# Patient Record
Sex: Male | Born: 1965
Health system: Southern US, Community
[De-identification: ages and names within clinical notes are randomized; demographics above are authoritative.]

## PROBLEM LIST (undated history)

## (undated) DIAGNOSIS — M1990 Unspecified osteoarthritis, unspecified site: Secondary | ICD-10-CM

## (undated) DIAGNOSIS — C61 Malignant neoplasm of prostate: Secondary | ICD-10-CM

## (undated) HISTORY — PX: PROSTATE BIOPSY: SHX241

## (undated) HISTORY — PX: TOTAL KNEE ARTHROPLASTY: SHX125

---

## 1990-12-01 HISTORY — PX: ANTERIOR CRUCIATE LIGAMENT REPAIR: SHX115

## 2017-03-06 DIAGNOSIS — M1712 Unilateral primary osteoarthritis, left knee: Secondary | ICD-10-CM | POA: Diagnosis not present

## 2017-03-06 DIAGNOSIS — M1711 Unilateral primary osteoarthritis, right knee: Secondary | ICD-10-CM | POA: Diagnosis not present

## 2017-03-13 DIAGNOSIS — M1711 Unilateral primary osteoarthritis, right knee: Secondary | ICD-10-CM | POA: Diagnosis not present

## 2017-03-13 DIAGNOSIS — M1712 Unilateral primary osteoarthritis, left knee: Secondary | ICD-10-CM | POA: Diagnosis not present

## 2017-03-13 DIAGNOSIS — M25562 Pain in left knee: Secondary | ICD-10-CM | POA: Diagnosis not present

## 2017-03-13 DIAGNOSIS — M25561 Pain in right knee: Secondary | ICD-10-CM | POA: Diagnosis not present

## 2017-03-13 DIAGNOSIS — G8929 Other chronic pain: Secondary | ICD-10-CM | POA: Diagnosis not present

## 2017-05-15 DIAGNOSIS — Z7189 Other specified counseling: Secondary | ICD-10-CM | POA: Diagnosis not present

## 2017-05-25 DIAGNOSIS — M130 Polyarthritis, unspecified: Secondary | ICD-10-CM | POA: Diagnosis not present

## 2017-06-04 DIAGNOSIS — Z125 Encounter for screening for malignant neoplasm of prostate: Secondary | ICD-10-CM | POA: Diagnosis not present

## 2017-06-04 DIAGNOSIS — Z0189 Encounter for other specified special examinations: Secondary | ICD-10-CM | POA: Diagnosis not present

## 2017-06-09 DIAGNOSIS — Z0189 Encounter for other specified special examinations: Secondary | ICD-10-CM | POA: Diagnosis not present

## 2017-06-12 DIAGNOSIS — M130 Polyarthritis, unspecified: Secondary | ICD-10-CM | POA: Diagnosis not present

## 2017-06-12 DIAGNOSIS — Z6826 Body mass index (BMI) 26.0-26.9, adult: Secondary | ICD-10-CM | POA: Diagnosis not present

## 2017-06-12 DIAGNOSIS — R972 Elevated prostate specific antigen [PSA]: Secondary | ICD-10-CM | POA: Diagnosis not present

## 2017-06-12 DIAGNOSIS — Z0001 Encounter for general adult medical examination with abnormal findings: Secondary | ICD-10-CM | POA: Diagnosis not present

## 2017-06-18 DIAGNOSIS — N402 Nodular prostate without lower urinary tract symptoms: Secondary | ICD-10-CM | POA: Diagnosis not present

## 2017-06-18 DIAGNOSIS — Z7189 Other specified counseling: Secondary | ICD-10-CM | POA: Diagnosis not present

## 2017-06-18 DIAGNOSIS — R972 Elevated prostate specific antigen [PSA]: Secondary | ICD-10-CM | POA: Diagnosis not present

## 2017-06-18 DIAGNOSIS — F17290 Nicotine dependence, other tobacco product, uncomplicated: Secondary | ICD-10-CM | POA: Diagnosis not present

## 2017-07-28 DIAGNOSIS — Z96651 Presence of right artificial knee joint: Secondary | ICD-10-CM | POA: Diagnosis not present

## 2017-07-28 DIAGNOSIS — Z9889 Other specified postprocedural states: Secondary | ICD-10-CM | POA: Diagnosis not present

## 2017-07-29 DIAGNOSIS — R972 Elevated prostate specific antigen [PSA]: Secondary | ICD-10-CM | POA: Diagnosis not present

## 2017-07-31 DIAGNOSIS — R3914 Feeling of incomplete bladder emptying: Secondary | ICD-10-CM | POA: Diagnosis not present

## 2017-07-31 DIAGNOSIS — R972 Elevated prostate specific antigen [PSA]: Secondary | ICD-10-CM | POA: Diagnosis not present

## 2017-09-01 DIAGNOSIS — R972 Elevated prostate specific antigen [PSA]: Secondary | ICD-10-CM | POA: Diagnosis not present

## 2017-09-15 ENCOUNTER — Encounter: Payer: Self-pay | Admitting: Radiation Oncology

## 2017-09-21 DIAGNOSIS — N5201 Erectile dysfunction due to arterial insufficiency: Secondary | ICD-10-CM | POA: Diagnosis not present

## 2017-09-21 DIAGNOSIS — C61 Malignant neoplasm of prostate: Secondary | ICD-10-CM | POA: Diagnosis not present

## 2017-09-23 ENCOUNTER — Other Ambulatory Visit: Payer: Self-pay | Admitting: Urology

## 2017-09-23 ENCOUNTER — Encounter: Payer: Self-pay | Admitting: Radiation Oncology

## 2017-09-23 NOTE — Progress Notes (Signed)
GU Location of Tumor / Histology: prostatic adenocarcinoma  If Prostate Cancer, Gleason Score is (3 + 4) and PSA is (8.0). Prostate volume: 36.8 grams  Arville Go was referred by Dr. Lucianne Lei, MD for evaluation of an elevated PSA.  Biopsies of prostate (if applicable) revealed:    Past/Anticipated interventions by urology, if any: prostate biopsy  Past/Anticipated interventions by medical oncology, if any: no  Weight changes, if any: no  Bowel/Bladder complaints, if any: sensation of not emptying his bladder,    Nausea/Vomiting, if any: no  Pain issues, if any:  no  SAFETY ISSUES:  Prior radiation? no  Pacemaker/ICD? no  Possible current pregnancy? no  Is the patient on methotrexate? no  Current Complaints / other details:  51 year old. Male. Married. Father with hx of prostate ca. NKA.

## 2017-09-24 ENCOUNTER — Encounter: Payer: Self-pay | Admitting: Urology

## 2017-09-24 NOTE — Progress Notes (Signed)
Per Anise Salvo, patient called to cancel his appointment with Dr. Tammi Klippel on 09/28/17. He stated he has decided to have Robotic Surgery and it is scheduled for 10/16/2017 with Dr. Tresa Moore.

## 2017-09-28 ENCOUNTER — Ambulatory Visit
Admission: RE | Admit: 2017-09-28 | Discharge: 2017-09-28 | Disposition: A | Discharge status: Home / Self Care | Payer: Self-pay | Source: Ambulatory Visit | Attending: Radiation Oncology | Admitting: Radiation Oncology

## 2017-09-28 HISTORY — DX: Malignant neoplasm of prostate: C61

## 2017-09-30 DIAGNOSIS — C61 Malignant neoplasm of prostate: Secondary | ICD-10-CM | POA: Diagnosis not present

## 2017-09-30 DIAGNOSIS — M6281 Muscle weakness (generalized): Secondary | ICD-10-CM | POA: Diagnosis not present

## 2017-10-08 DIAGNOSIS — M62838 Other muscle spasm: Secondary | ICD-10-CM | POA: Diagnosis not present

## 2017-10-08 DIAGNOSIS — M6281 Muscle weakness (generalized): Secondary | ICD-10-CM | POA: Diagnosis not present

## 2017-10-08 DIAGNOSIS — N393 Stress incontinence (female) (male): Secondary | ICD-10-CM | POA: Diagnosis not present

## 2017-10-12 NOTE — Progress Notes (Signed)
EKG 05-25-17 on chart from Dr Criss Rosales office

## 2017-10-12 NOTE — Patient Instructions (Signed)
AVEL OGAWA  10/12/2017   Your procedure is scheduled on: 10-16-17  Report to [facility] Main  Entrance Take [place]  elevators to 3rd floor to  [facility] at 530AM.    Call this number if you have problems the morning of surgery [phone]   Remember: ONLY 1 PERSON MAY GO WITH YOU TO SHORT STAY TO GET  READY MORNING OF YOUR SURGERY.  Do not eat food After Midnight on Wednesday 10-14-17. Drink plenty of clear liquids all day Thursday 10-15-17 and follow all bowel prep instructions provided by your surgeon. Nothing by mouth after midnight on Thursday!!     Take these medicines the morning of surgery with A SIP OF WATER: tylenol if needed                                You may not have any metal on your body including hair pins and              piercings  Do not wear jewelry, make-up, lotions, powders or perfumes, deodorant                          Men may shave face and neck.   Do not bring valuables to the hospital. [place].  Contacts, dentures or bridgework may not be worn into surgery.  Leave suitcase in the car. After surgery it may be brought to your room.                  Please read over the following fact sheets you were given: _____________________________________________________________________    CLEAR LIQUID DIET   Foods Allowed                                                                     Foods Excluded  Coffee and tea, regular and decaf                             liquids that you cannot  Plain Jell-O in any flavor                                             see through such as: Fruit ices (not with fruit pulp)                                     milk, soups, orange juice  Iced Popsicles                                    All solid food Carbonated beverages, regular and diet  Cranberry, grape and apple juices Sports drinks like  Gatorade Lightly seasoned clear broth or consume(fat free) Sugar, honey syrup  Sample Menu Breakfast                                Lunch                                     Supper Cranberry juice                    Beef broth                            Chicken broth Jell-O                                     Grape juice                           Apple juice Coffee or tea                        Jell-O                                      Popsicle                                                Coffee or tea                        Coffee or tea  _____________________________________________________________________  [facility] - Preparing for Surgery Before surgery, you can play an important role.  Because skin is not sterile, your skin needs to be as free of germs as possible.  You can reduce the number of germs on your skin by washing with CHG (chlorahexidine gluconate) soap before surgery.  CHG is an antiseptic cleaner which kills germs and bonds with the skin to continue killing germs even after washing. Please DO NOT use if you have an allergy to CHG or antibacterial soaps.  If your skin becomes reddened/irritated stop using the CHG and inform your nurse when you arrive at Short Stay. Do not shave (including legs and underarms) for at least 48 hours prior to the first CHG shower.  You may shave your face/neck. Please follow these instructions carefully:  1.  Shower with CHG Soap the night before surgery and the  morning of Surgery.  2.  If you choose to wash your hair, wash your hair first as usual with your  normal  shampoo.  3.  After you shampoo, rinse your hair and body thoroughly to remove the  shampoo.                           4.  Use CHG as you would any other liquid soap.  You can apply chg directly  to the skin and wash  Gently with a scrungie or clean washcloth.  5.  Apply the CHG Soap to your body ONLY FROM THE NECK DOWN.   Do not use on face/ open                            Wound or open sores. Avoid contact with eyes, ears mouth and genitals (private parts).                       Wash face,  Genitals (private parts) with your normal soap.             6.  Wash thoroughly, paying special attention to the area where your surgery  will be performed.  7.  Thoroughly rinse your body with warm water from the neck down.  8.  DO NOT shower/wash with your normal soap after using and rinsing off  the CHG Soap.                9.  Pat yourself dry with a clean towel.            10.  Wear clean pajamas.            11.  Place clean sheets on your bed the night of your first shower and do not  sleep with pets. Day of Surgery : Do not apply any lotions/deodorants the morning of surgery.  Please wear clean clothes to the hospital/surgery center.  FAILURE TO FOLLOW THESE INSTRUCTIONS MAY RESULT IN THE CANCELLATION OF YOUR SURGERY PATIENT SIGNATURE_________________________________  NURSE SIGNATURE__________________________________  ________________________________________________________________________

## 2017-10-14 ENCOUNTER — Other Ambulatory Visit: Payer: Self-pay

## 2017-10-14 ENCOUNTER — Encounter (HOSPITAL_COMMUNITY): Payer: Self-pay

## 2017-10-14 ENCOUNTER — Encounter (HOSPITAL_COMMUNITY)
Admission: RE | Admit: 2017-10-14 | Discharge: 2017-10-14 | Disposition: A | Discharge status: Home / Self Care | Payer: BLUE CROSS/BLUE SHIELD | Source: Ambulatory Visit | Attending: Urology | Admitting: Urology

## 2017-10-14 DIAGNOSIS — Z7982 Long term (current) use of aspirin: Secondary | ICD-10-CM | POA: Diagnosis not present

## 2017-10-14 DIAGNOSIS — C775 Secondary and unspecified malignant neoplasm of intrapelvic lymph nodes: Secondary | ICD-10-CM | POA: Diagnosis not present

## 2017-10-14 DIAGNOSIS — C61 Malignant neoplasm of prostate: Secondary | ICD-10-CM | POA: Diagnosis not present

## 2017-10-14 DIAGNOSIS — Z79899 Other long term (current) drug therapy: Secondary | ICD-10-CM | POA: Diagnosis not present

## 2017-10-14 DIAGNOSIS — Z8042 Family history of malignant neoplasm of prostate: Secondary | ICD-10-CM | POA: Diagnosis not present

## 2017-10-14 DIAGNOSIS — F17210 Nicotine dependence, cigarettes, uncomplicated: Secondary | ICD-10-CM | POA: Diagnosis not present

## 2017-10-14 DIAGNOSIS — Z96651 Presence of right artificial knee joint: Secondary | ICD-10-CM | POA: Diagnosis not present

## 2017-10-14 HISTORY — DX: Unspecified osteoarthritis, unspecified site: M19.90

## 2017-10-14 LAB — BASIC METABOLIC PANEL
Anion gap: 6 (ref 5–15)
BUN: 16 mg/dL (ref 6–20)
CALCIUM: 9.7 mg/dL (ref 8.9–10.3)
CO2: 24 mmol/L (ref 22–32)
CREATININE: 0.89 mg/dL (ref 0.61–1.24)
Chloride: 108 mmol/L (ref 101–111)
GFR calc Af Amer: >60 mL/min (ref >60)
GFR calc non Af Amer: >60 mL/min (ref >60)
GLUCOSE: 112 mg/dL — AB (ref 65–99)
Potassium: 4.2 mmol/L (ref 3.5–5.1)
Sodium: 138 mmol/L (ref 135–145)

## 2017-10-14 LAB — CBC
HEMATOCRIT: 41.5 % (ref 39.0–52.0)
Hemoglobin: 14.4 g/dL (ref 13.0–17.0)
MCH: 30.4 pg (ref 26.0–34.0)
MCHC: 34.7 g/dL (ref 30.0–36.0)
MCV: 87.6 fL (ref 78.0–100.0)
Platelets: 242 10*3/uL (ref 150–400)
RBC: 4.74 MIL/uL (ref 4.22–5.81)
RDW: 12.9 % (ref 11.5–15.5)
WBC: 5.2 10*3/uL (ref 4.0–10.5)

## 2017-10-14 NOTE — Progress Notes (Signed)
   10/14/17 0827  OBSTRUCTIVE SLEEP APNEA  Have you ever been diagnosed with sleep apnea through a sleep study? No  Do you snore loudly (loud enough to be heard through closed doors)?  1  Do you often feel tired, fatigued, or sleepy during the daytime (such as falling asleep during driving or talking to someone)? 0  Has anyone observed you stop breathing during your sleep? 1  Do you have, or are you being treated for high blood pressure? 0  BMI more than 35 kg/m2? 0  Age > 50 (1-yes) 1  Neck circumference greater than:Male 16 inches or larger, Male 17inches or larger? 1  Male Gender (Yes=1) 1  Obstructive Sleep Apnea Score 5

## 2017-10-16 ENCOUNTER — Ambulatory Visit (HOSPITAL_COMMUNITY): Payer: BLUE CROSS/BLUE SHIELD | Admitting: Certified Registered Nurse Anesthetist

## 2017-10-16 ENCOUNTER — Encounter (HOSPITAL_COMMUNITY)
Admission: RE | Disposition: A | Discharge status: Home / Self Care | Payer: Self-pay | Source: Ambulatory Visit | Attending: Urology

## 2017-10-16 ENCOUNTER — Other Ambulatory Visit: Payer: Self-pay

## 2017-10-16 ENCOUNTER — Encounter (HOSPITAL_COMMUNITY): Payer: Self-pay | Admitting: *Deleted

## 2017-10-16 ENCOUNTER — Observation Stay (HOSPITAL_COMMUNITY)
Admission: RE | Admit: 2017-10-16 | Discharge: 2017-10-17 | Disposition: A | Discharge status: Home / Self Care | Payer: BLUE CROSS/BLUE SHIELD | Source: Ambulatory Visit | Attending: Urology | Admitting: Urology

## 2017-10-16 DIAGNOSIS — C775 Secondary and unspecified malignant neoplasm of intrapelvic lymph nodes: Secondary | ICD-10-CM | POA: Diagnosis not present

## 2017-10-16 DIAGNOSIS — Z8042 Family history of malignant neoplasm of prostate: Secondary | ICD-10-CM | POA: Insufficient documentation

## 2017-10-16 DIAGNOSIS — Z79899 Other long term (current) drug therapy: Secondary | ICD-10-CM | POA: Diagnosis not present

## 2017-10-16 DIAGNOSIS — F17210 Nicotine dependence, cigarettes, uncomplicated: Secondary | ICD-10-CM | POA: Insufficient documentation

## 2017-10-16 DIAGNOSIS — Z96651 Presence of right artificial knee joint: Secondary | ICD-10-CM | POA: Insufficient documentation

## 2017-10-16 DIAGNOSIS — C61 Malignant neoplasm of prostate: Principal | ICD-10-CM | POA: Insufficient documentation

## 2017-10-16 DIAGNOSIS — Z7982 Long term (current) use of aspirin: Secondary | ICD-10-CM | POA: Diagnosis not present

## 2017-10-16 HISTORY — PX: ROBOT ASSISTED LAPAROSCOPIC RADICAL PROSTATECTOMY: SHX5141

## 2017-10-16 HISTORY — PX: LYMPHADENECTOMY: SHX5960

## 2017-10-16 LAB — HEMOGLOBIN AND HEMATOCRIT, BLOOD
HCT: 40.4 % (ref 39.0–52.0)
HEMOGLOBIN: 14.1 g/dL (ref 13.0–17.0)

## 2017-10-16 SURGERY — PROSTATECTOMY, RADICAL, ROBOT-ASSISTED, LAPAROSCOPIC
Anesthesia: General

## 2017-10-16 MED ORDER — ONDANSETRON HCL 4 MG/2ML IJ SOLN: 4.0000 mg | Freq: Once | INTRAMUSCULAR | Status: DC | PRN

## 2017-10-16 MED ORDER — FENTANYL CITRATE (PF) 250 MCG/5ML IJ SOLN
INTRAMUSCULAR | Status: AC
  Filled 2017-10-16: qty 5

## 2017-10-16 MED ORDER — SODIUM CHLORIDE 0.9 % IJ SOLN
INTRAMUSCULAR | Status: AC
  Filled 2017-10-16: qty 20

## 2017-10-16 MED ORDER — BUPIVACAINE LIPOSOME 1.3 % IJ SUSP
20.0000 mL | Freq: Once | INTRAMUSCULAR | Status: AC
  Administered 2017-10-16: 20 mL
  Filled 2017-10-16: qty 20

## 2017-10-16 MED ORDER — LACTATED RINGERS IV SOLN
INTRAVENOUS | Status: DC | PRN
  Administered 2017-10-16 (×2): via INTRAVENOUS

## 2017-10-16 MED ORDER — ONDANSETRON HCL 4 MG/2ML IJ SOLN
INTRAMUSCULAR | Status: AC
  Filled 2017-10-16: qty 2

## 2017-10-16 MED ORDER — INDOCYANINE GREEN 25 MG IV SOLR
INTRAVENOUS | Status: DC | PRN
  Administered 2017-10-16: 1 mg

## 2017-10-16 MED ORDER — PROMETHAZINE HCL 25 MG/ML IJ SOLN
25.0000 mg | INTRAMUSCULAR | Status: DC | PRN
  Administered 2017-10-16: 25 mg via INTRAVENOUS
  Filled 2017-10-16: qty 1

## 2017-10-16 MED ORDER — FENTANYL CITRATE (PF) 100 MCG/2ML IJ SOLN
INTRAMUSCULAR | Status: DC | PRN
  Administered 2017-10-16: 100 ug via INTRAVENOUS
  Administered 2017-10-16 (×4): 50 ug via INTRAVENOUS

## 2017-10-16 MED ORDER — MIDAZOLAM HCL 2 MG/2ML IJ SOLN
INTRAMUSCULAR | Status: AC
  Filled 2017-10-16: qty 2

## 2017-10-16 MED ORDER — LACTATED RINGERS IR SOLN
Status: DC | PRN
  Administered 2017-10-16: 1000 mL

## 2017-10-16 MED ORDER — PROPOFOL 10 MG/ML IV BOLUS
INTRAVENOUS | Status: DC | PRN
  Administered 2017-10-16: 150 mg via INTRAVENOUS

## 2017-10-16 MED ORDER — MEPERIDINE HCL 50 MG/ML IJ SOLN: 6.2500 mg | INTRAMUSCULAR | Status: DC | PRN

## 2017-10-16 MED ORDER — HYDROCODONE-ACETAMINOPHEN 5-325 MG PO TABS: 1.0000 | ORAL_TABLET | Freq: Four times a day (QID) | ORAL | 0 refills | Status: AC | PRN

## 2017-10-16 MED ORDER — DEXAMETHASONE SODIUM PHOSPHATE 10 MG/ML IJ SOLN
INTRAMUSCULAR | Status: DC | PRN
  Administered 2017-10-16: 10 mg via INTRAVENOUS

## 2017-10-16 MED ORDER — PROPOFOL 10 MG/ML IV BOLUS
INTRAVENOUS | Status: AC
  Filled 2017-10-16: qty 20

## 2017-10-16 MED ORDER — SODIUM CHLORIDE 0.9 % IV BOLUS (SEPSIS)
1000.0000 mL | Freq: Once | INTRAVENOUS | Status: AC
  Administered 2017-10-16: 1000 mL via INTRAVENOUS

## 2017-10-16 MED ORDER — HYDROMORPHONE HCL 1 MG/ML IJ SOLN
0.5000 mg | INTRAMUSCULAR | Status: DC | PRN
  Administered 2017-10-16 (×4): 1 mg via INTRAVENOUS
  Filled 2017-10-16 (×4): qty 1

## 2017-10-16 MED ORDER — HYDROMORPHONE HCL 1 MG/ML IJ SOLN
0.2500 mg | INTRAMUSCULAR | Status: DC | PRN
  Administered 2017-10-16 (×2): 0.5 mg via INTRAVENOUS

## 2017-10-16 MED ORDER — SULFAMETHOXAZOLE-TRIMETHOPRIM 800-160 MG PO TABS: 1.0000 | ORAL_TABLET | Freq: Two times a day (BID) | ORAL | 0 refills | Status: AC

## 2017-10-16 MED ORDER — ACETAMINOPHEN 500 MG PO TABS
1000.0000 mg | ORAL_TABLET | Freq: Four times a day (QID) | ORAL | Status: AC
  Administered 2017-10-16 – 2017-10-17 (×4): 1000 mg via ORAL
  Filled 2017-10-16 (×4): qty 2

## 2017-10-16 MED ORDER — ROCURONIUM BROMIDE 10 MG/ML (PF) SYRINGE
PREFILLED_SYRINGE | INTRAVENOUS | Status: DC | PRN
  Administered 2017-10-16 (×3): 10 mg via INTRAVENOUS
  Administered 2017-10-16: 20 mg via INTRAVENOUS
  Administered 2017-10-16: 50 mg via INTRAVENOUS

## 2017-10-16 MED ORDER — SUGAMMADEX SODIUM 200 MG/2ML IV SOLN
INTRAVENOUS | Status: DC | PRN
  Administered 2017-10-16: 200 mg via INTRAVENOUS

## 2017-10-16 MED ORDER — DIPHENHYDRAMINE HCL 50 MG/ML IJ SOLN: 12.5000 mg | Freq: Four times a day (QID) | INTRAMUSCULAR | Status: DC | PRN

## 2017-10-16 MED ORDER — CEFAZOLIN SODIUM-DEXTROSE 2-4 GM/100ML-% IV SOLN
INTRAVENOUS | Status: AC
  Filled 2017-10-16: qty 100

## 2017-10-16 MED ORDER — PROMETHAZINE HCL 25 MG/ML IJ SOLN: 25.0000 mg | INTRAMUSCULAR | Status: DC | PRN

## 2017-10-16 MED ORDER — OXYCODONE HCL 5 MG PO TABS: 5.0000 mg | ORAL_TABLET | ORAL | Status: DC | PRN

## 2017-10-16 MED ORDER — ONDANSETRON HCL 4 MG/2ML IJ SOLN
INTRAMUSCULAR | Status: DC | PRN
  Administered 2017-10-16: 4 mg via INTRAVENOUS

## 2017-10-16 MED ORDER — MIDAZOLAM HCL 5 MG/5ML IJ SOLN
INTRAMUSCULAR | Status: DC | PRN
Start: 2017-10-16 — End: 2017-10-16
  Administered 2017-10-16: 2 mg via INTRAVENOUS

## 2017-10-16 MED ORDER — ONDANSETRON HCL 4 MG/2ML IJ SOLN
4.0000 mg | INTRAMUSCULAR | Status: DC | PRN
  Administered 2017-10-16: 4 mg via INTRAVENOUS
  Filled 2017-10-16: qty 2

## 2017-10-16 MED ORDER — SODIUM CHLORIDE 0.9 % IJ SOLN
INTRAMUSCULAR | Status: DC | PRN
  Administered 2017-10-16: 20 mL

## 2017-10-16 MED ORDER — CEFAZOLIN SODIUM-DEXTROSE 2-4 GM/100ML-% IV SOLN
2.0000 g | INTRAVENOUS | Status: AC
  Administered 2017-10-16: 2 g via INTRAVENOUS

## 2017-10-16 MED ORDER — DIPHENHYDRAMINE HCL 12.5 MG/5ML PO ELIX: 12.5000 mg | ORAL_SOLUTION | Freq: Four times a day (QID) | ORAL | Status: DC | PRN

## 2017-10-16 MED ORDER — HYDROMORPHONE HCL 1 MG/ML IJ SOLN
INTRAMUSCULAR | Status: AC
  Filled 2017-10-16: qty 2

## 2017-10-16 MED ORDER — DEXTROSE-NACL 5-0.45 % IV SOLN
INTRAVENOUS | Status: DC
  Administered 2017-10-16 – 2017-10-17 (×3): via INTRAVENOUS

## 2017-10-16 SURGICAL SUPPLY — 73 items
ADH SKN CLS APL DERMABOND .7 (GAUZE/BANDAGES/DRESSINGS) ×2
APPLICATOR COTTON TIP 6IN STRL (MISCELLANEOUS) ×3 IMPLANT
BAG URO CATCHER STRL LF (MISCELLANEOUS) ×2 IMPLANT
CATH FOLEY 2WAY SLVR 18FR 30CC (CATHETERS) ×3 IMPLANT
CATH TIEMANN FOLEY 18FR 5CC (CATHETERS) ×3 IMPLANT
CHLORAPREP W/TINT 26ML (MISCELLANEOUS) ×3 IMPLANT
CLIP VESOLOCK LG 6/CT PURPLE (CLIP) ×8 IMPLANT
CLOTH BEACON ORANGE TIMEOUT ST (SAFETY) ×3 IMPLANT
CONT SPEC 4OZ CLIKSEAL STRL BL (MISCELLANEOUS) ×3 IMPLANT
COVER FOOTSWITCH UNIV (MISCELLANEOUS) IMPLANT
COVER SURGICAL LIGHT HANDLE (MISCELLANEOUS) ×3 IMPLANT
COVER TIP SHEARS 8 DVNC (MISCELLANEOUS) ×2 IMPLANT
COVER TIP SHEARS 8MM DA VINCI (MISCELLANEOUS) ×1
CUTTER ECHEON FLEX ENDO 45 340 (ENDOMECHANICALS) ×3 IMPLANT
DECANTER SPIKE VIAL GLASS SM (MISCELLANEOUS) ×3 IMPLANT
DERMABOND ADVANCED (GAUZE/BANDAGES/DRESSINGS) ×1
DERMABOND ADVANCED .7 DNX12 (GAUZE/BANDAGES/DRESSINGS) ×2 IMPLANT
DRAPE ARM DVNC X/XI (DISPOSABLE) ×8 IMPLANT
DRAPE COLUMN DVNC XI (DISPOSABLE) ×2 IMPLANT
DRAPE DA VINCI XI ARM (DISPOSABLE) ×4
DRAPE DA VINCI XI COLUMN (DISPOSABLE) ×1
DRAPE SURG IRRIG POUCH 19X23 (DRAPES) ×3 IMPLANT
DRSG TEGADERM 4X4.75 (GAUZE/BANDAGES/DRESSINGS) ×2 IMPLANT
ELECT PENCIL ROCKER SW 15FT (MISCELLANEOUS) ×1 IMPLANT
ELECT REM PT RETURN 15FT ADLT (MISCELLANEOUS) ×3 IMPLANT
GAUZE SPONGE 2X2 8PLY STRL LF (GAUZE/BANDAGES/DRESSINGS) ×2 IMPLANT
GLOVE BIO SURGEON STRL SZ 6.5 (GLOVE) ×5 IMPLANT
GLOVE BIOGEL M STRL SZ7.5 (GLOVE) ×8 IMPLANT
GLOVE BIOGEL PI IND STRL 7.5 (GLOVE) ×2 IMPLANT
GLOVE BIOGEL PI INDICATOR 7.5 (GLOVE) ×1
GOWN STRL REUS W/TWL LRG LVL3 (GOWN DISPOSABLE) ×11 IMPLANT
GOWN STRL REUS W/TWL XL LVL3 (GOWN DISPOSABLE) ×2 IMPLANT
HOLDER FOLEY CATH W/STRAP (MISCELLANEOUS) ×3 IMPLANT
IRRIG SUCT STRYKERFLOW 2 WTIP (MISCELLANEOUS) ×3
IRRIGATION SUCT STRKRFLW 2 WTP (MISCELLANEOUS) ×2 IMPLANT
IV LACTATED RINGERS 1000ML (IV SOLUTION) ×2 IMPLANT
KIT PROCEDURE DA VINCI SI (MISCELLANEOUS) ×1
KIT PROCEDURE DVNC SI (MISCELLANEOUS) ×2 IMPLANT
MANIFOLD NEPTUNE II (INSTRUMENTS) ×3 IMPLANT
NDL INSUFFLATION 14GA 120MM (NEEDLE) ×2 IMPLANT
NDL SPNL 22GX7 QUINCKE BK (NEEDLE) ×2 IMPLANT
NEEDLE INSUFFLATION 14GA 120MM (NEEDLE) ×3 IMPLANT
NEEDLE SPNL 22GX7 QUINCKE BK (NEEDLE) ×3 IMPLANT
PACK CYSTO (CUSTOM PROCEDURE TRAY) ×2 IMPLANT
PACK ROBOT UROLOGY CUSTOM (CUSTOM PROCEDURE TRAY) ×3 IMPLANT
PAD POSITIONING PINK XL (MISCELLANEOUS) ×1 IMPLANT
PORT ACCESS TROCAR AIRSEAL 12 (TROCAR) ×2 IMPLANT
PORT ACCESS TROCAR AIRSEAL 5M (TROCAR) ×1
RELOAD STAPLE 45 4.1 GRN THCK (STAPLE) ×2 IMPLANT
SEAL CANN UNIV 5-8 DVNC XI (MISCELLANEOUS) ×8 IMPLANT
SEAL XI 5MM-8MM UNIVERSAL (MISCELLANEOUS) ×4
SET TRI-LUMEN FLTR TB AIRSEAL (TUBING) ×3 IMPLANT
SLEEVE SURGEON STRL (DRAPES) ×1 IMPLANT
SOLUTION ELECTROLUBE (MISCELLANEOUS) ×3 IMPLANT
SPONGE GAUZE 2X2 STER 10/PKG (GAUZE/BANDAGES/DRESSINGS)
SPONGE LAP 4X18 X RAY DECT (DISPOSABLE) ×3 IMPLANT
STAPLE RELOAD 45 GRN (STAPLE) ×2 IMPLANT
STAPLE RELOAD 45MM GREEN (STAPLE) ×3
SUT ETHILON 3 0 PS 1 (SUTURE) ×3 IMPLANT
SUT MNCRL AB 4-0 PS2 18 (SUTURE) ×6 IMPLANT
SUT PDS AB 1 CT1 27 (SUTURE) ×6 IMPLANT
SUT VIC AB 2-0 SH 27 (SUTURE) ×3
SUT VIC AB 2-0 SH 27X BRD (SUTURE) ×2 IMPLANT
SUT VICRYL 0 UR6 27IN ABS (SUTURE) ×3 IMPLANT
SUT VLOC BARB 180 ABS3/0GR12 (SUTURE) ×9
SUTURE VLOC BRB 180 ABS3/0GR12 (SUTURE) ×6 IMPLANT
SYR 27GX1/2 1ML LL SAFETY (SYRINGE) ×3 IMPLANT
SYR CONTROL 10ML LL (SYRINGE) IMPLANT
TOWEL OR 17X26 10 PK STRL BLUE (TOWEL DISPOSABLE) ×3 IMPLANT
TOWEL OR NON WOVEN STRL DISP B (DISPOSABLE) ×3 IMPLANT
TUBING CONNECTING 10 (TUBING) IMPLANT
WATER STERILE IRR 1000ML POUR (IV SOLUTION) ×4 IMPLANT
WATER STERILE IRR 3000ML UROMA (IV SOLUTION) ×2 IMPLANT

## 2017-10-16 NOTE — Anesthesia Procedure Notes (Signed)
Procedure Name: Intubation Performed by: Gean Maidens, CRNA Pre-anesthesia Checklist: Patient identified, Emergency Drugs available, Suction available, Patient being monitored and Timeout performed Patient Re-evaluated:Patient Re-evaluated prior to induction Oxygen Delivery Method: Circle system utilized Preoxygenation: Pre-oxygenation with 100% oxygen Induction Type: IV induction Ventilation: Mask ventilation without difficulty and Oral airway inserted - appropriate to patient size Laryngoscope Size: Mac and 4 Grade View: Grade III Tube type: Oral Tube size: 7.5 mm Number of attempts: 1 Airway Equipment and Method: Stylet Placement Confirmation: ETT inserted through vocal cords under direct vision,  positive ETCO2,  CO2 detector and breath sounds checked- equal and bilateral Secured at: 22 cm Tube secured with: Tape Dental Injury: Teeth and Oropharynx as per pre-operative assessment  Difficulty Due To: Difficulty was unanticipated Future Recommendations: Recommend- induction with short-acting agent, and alternative techniques readily available

## 2017-10-16 NOTE — Brief Op Note (Signed)
10/16/2017  10:26 AM  PATIENT:  Seth Herrera  51 y.o. male  PRE-OPERATIVE DIAGNOSIS:  PROSTATE CANCER  POST-OPERATIVE DIAGNOSIS:  PROSTATE CANCER  PROCEDURE:  Procedure(s): XI ROBOTIC ASSISTED LAPAROSCOPIC RADICAL PROSTATECTOMY WITH INJECTION OF GREEN DYE (N/A) PELVIC LYMPHADENECTOMY (Bilateral)  SURGEON:  Surgeon(s) and Role:    * Alexis Frock, MD - Primary  PHYSICIAN ASSISTANT:   ASSISTANTS: Debbrah Alar PA   ANESTHESIA:   local and general  EBL:  150 mL   BLOOD ADMINISTERED:none  DRAINS: 1 -jp to bulb; 2 Foley to gravity   LOCAL MEDICATIONS USED:  MARCAINE     SPECIMEN:  Source of Specimen:  periprostatic fat, pelvic lymph nodes, prostatectomy  DISPOSITION OF SPECIMEN:  PATHOLOGY  COUNTS:  YES  TOURNIQUET:  * No tourniquets in log *  DICTATION: .Other Dictation: Dictation Number  [id number]  PLAN OF CARE: Admit for overnight observation  PATIENT DISPOSITION:  PACU - hemodynamically stable.   Delay start of Pharmacological VTE agent (>24hrs) due to surgical blood loss or risk of bleeding: yes

## 2017-10-16 NOTE — Anesthesia Preprocedure Evaluation (Signed)
Anesthesia Evaluation  Patient identified by MRN, date of birth, ID band Patient awake    Reviewed: Allergy & Precautions, NPO status , Patient's Chart, lab work & pertinent test results  Airway Mallampati: I  TM Distance: >3 FB Neck ROM: Full    Dental   Pulmonary Current Smoker,    Pulmonary exam normal        Cardiovascular Normal cardiovascular exam     Neuro/Psych    GI/Hepatic   Endo/Other    Renal/GU      Musculoskeletal   Abdominal   Peds  Hematology   Anesthesia Other Findings   Reproductive/Obstetrics                             Anesthesia Physical Anesthesia Plan  ASA: II  Anesthesia Plan: General   Post-op Pain Management:    Induction: Intravenous  PONV Risk Score and Plan: 1 and Dexamethasone and Ondansetron  Airway Management Planned: Oral ETT  Additional Equipment:   Intra-op Plan:   Post-operative Plan: Extubation in OR  Informed Consent: I have reviewed the patients History and Physical, chart, labs and discussed the procedure including the risks, benefits and alternatives for the proposed anesthesia with the patient or authorized representative who has indicated his/her understanding and acceptance.     Plan Discussed with: CRNA and Surgeon  Anesthesia Plan Comments:         Anesthesia Quick Evaluation

## 2017-10-16 NOTE — Progress Notes (Signed)
Post-op note  Subjective: The patient is doing well. Nauseated. Pain well controlled   Objective: Vital signs in last 24 hours: Temp:  [97.5 F (36.4 C)-97.7 F (36.5 C)] 97.7 F (36.5 C) (11/16 1145) Pulse Rate:  [41-78] 70 (11/16 1145) Resp:  [15-19] 16 (11/16 1145) BP: (115-132)/(71-78) 132/72 (11/16 1145) SpO2:  [100 %] 100 % (11/16 1145) Weight:  [101.6 kg (223 lb 15.8 oz)-101.6 kg (224 lb)] 101.6 kg (223 lb 15.8 oz) (11/16 1211)  Intake/Output from previous day: No intake/output data recorded. Intake/Output this shift: Total I/O In: 1400 [I.V.:1400] Out: 650 [Urine:360; Drains:140; Blood:150]  Physical Exam:  General: Alert and oriented. Abdomen: Soft, Nondistended, JP with SS output, incisions c/d/i Incisions: Clean and dry. GU: foley catheter to drainage, draining clear  Lab Results: Recent Labs    10/14/17 0844 10/16/17 1045  HGB 14.4 14.1  HCT 41.5 40.4    Assessment/Plan: POD#0  S/p RALP  1) Continue to monitor   Alla Feeling, MD, MD   LOS: 0 days   Alla Feeling, MD 10/16/2017, 2:56 PM

## 2017-10-16 NOTE — Transfer of Care (Signed)
Immediate Anesthesia Transfer of Care Note  Patient: Seth Herrera  Procedure(s) Performed: XI ROBOTIC ASSISTED LAPAROSCOPIC RADICAL PROSTATECTOMY WITH INJECTION OF GREEN DYE (N/A ) PELVIC LYMPHADENECTOMY (Bilateral )  Patient Location: PACU  Anesthesia Type:General  Level of Consciousness: awake, alert  and oriented  Airway & Oxygen Therapy: Patient Spontanous Breathing and Patient connected to face mask oxygen  Post-op Assessment: Report given to RN and Post -op Vital signs reviewed and stable  Post vital signs: Reviewed and stable  Last Vitals: There were no vitals filed for this visit.  Last Pain: There were no vitals filed for this visit.    Patients Stated Pain Goal: 4 (42/87/68 1157)  Complications: No apparent anesthesia complications

## 2017-10-16 NOTE — Discharge Instructions (Signed)

## 2017-10-16 NOTE — Care Management Note (Signed)
Case Management Note  Patient Details  Name: Seth Herrera MRN: [id number] Date of Birth: [birth year]  Subjective/Objective:51 y/o m admitted w/Prostate Ca.From home.                    Action/Plan:d/c plan home.   Expected Discharge Date:                  Expected Discharge Plan:  Home/Self Care  In-House Referral:     Discharge planning Services  CM Consult  Post Acute Care Choice:    Choice offered to:     DME Arranged:    DME Agency:     HH Arranged:    HH Agency:     Status of Service:  In process, will continue to follow  If discussed at Long Length of Stay Meetings, dates discussed:    Additional Comments:  Dessa Phi, RN 10/16/2017, 12:16 PM

## 2017-10-16 NOTE — Plan of Care (Signed)
  Education: Knowledge of General Education information will improve 10/16/2017 2028 - Progressing by Ashley Murrain, RN   Health Behavior/Discharge Planning: Ability to manage health-related needs will improve 10/16/2017 2028 - Progressing by Ashley Murrain, RN

## 2017-10-16 NOTE — Anesthesia Postprocedure Evaluation (Signed)
Anesthesia Post Note  Patient: Seth Herrera  Procedure(s) Performed: XI ROBOTIC ASSISTED LAPAROSCOPIC RADICAL PROSTATECTOMY WITH INJECTION OF GREEN DYE (N/A ) PELVIC LYMPHADENECTOMY (Bilateral )     Patient location during evaluation: PACU Anesthesia Type: General Level of consciousness: awake and alert Pain management: pain level controlled Vital Signs Assessment: post-procedure vital signs reviewed and stable Respiratory status: spontaneous breathing, nonlabored ventilation, respiratory function stable and patient connected to nasal cannula oxygen Cardiovascular status: blood pressure returned to baseline and stable Postop Assessment: no apparent nausea or vomiting Anesthetic complications: no    Last Vitals:  Vitals:   10/16/17 1139 10/16/17 1145  BP: 125/76 132/72  Pulse: 78 70  Resp: 16 16  Temp: (!) 36.4 C 36.5 C  SpO2: 100% 100%    Last Pain:  Vitals:   10/16/17 1241  TempSrc:   PainSc: [name] DAVID

## 2017-10-16 NOTE — H&P (Signed)
Seth Herrera is an 51 y.o. male.    Chief Complaint: Pre-Op Prostatectomy  HPI:   1 - Large Volume Moderate Risk Prostate Cancer - 8/12 cores with Gleason 3+4=7 and 3+3=6, All L sided cores positive by TRUS BX 2018 by Dr. Alyson Ingles on eval rising PSA to 12. Pt's father with prostate cancer. TRUS 69m, no median lobe.   PMH sig for Rt knee replaceemnt. NO CV disease / blood thinners. His PCP is VLucianne LeiMD.   Today "Seth Herrera is to proceed with robotic prostatectomy with node dissection.    Past Medical History:  Diagnosis Date  . Arthritis   . Prostate cancer ([facility]     Past Surgical History:  Procedure Laterality Date  . ANTERIOR CRUCIATE LIGAMENT REPAIR Right 1992  . PROSTATE BIOPSY    . TOTAL KNEE ARTHROPLASTY Right    07-13-17    Family History  Problem Relation Age of Onset  . Cancer Father        prostate   Social History:  reports that he has been smoking cigarettes.  he has never used smokeless tobacco. He reports that he drinks alcohol. He reports that he does not use drugs.  Allergies: No Known Allergies  Medications Prior to Admission  Medication Sig Dispense Refill  . acetaminophen (TYLENOL) 650 MG CR tablet Take 1,300 mg every 8 (eight) hours as needed by mouth for pain.     .Marland Kitchenaspirin 325 MG tablet Take 325 mg 3 (three) times a week by mouth.       Results for orders placed or performed during the hospital encounter of 10/14/17 (from the past 48 hour(s))  CBC     Status: None   Collection Time: 10/14/17  8:44 AM  Result Value Ref Range   WBC 5.2 4.0 - 10.5 K/uL   RBC 4.74 4.22 - 5.81 MIL/uL   Hemoglobin 14.4 13.0 - 17.0 g/dL   HCT 41.5 39.0 - 52.0 %   MCV 87.6 78.0 - 100.0 fL   MCH 30.4 26.0 - 34.0 pg   MCHC 34.7 30.0 - 36.0 g/dL   RDW 12.9 11.5 - 15.5 %   Platelets 242 150 - 400 K/uL  Basic metabolic panel     Status: Abnormal   Collection Time: 10/14/17  8:44 AM  Result Value Ref Range   Sodium 138 135 - 145 mmol/L   Potassium 4.2 3.5 - 5.1  mmol/L   Chloride 108 101 - 111 mmol/L   CO2 24 22 - 32 mmol/L   Glucose, Bld 112 (H) 65 - 99 mg/dL   BUN 16 6 - 20 mg/dL   Creatinine, Ser 0.89 0.61 - 1.24 mg/dL   Calcium 9.7 8.9 - 10.3 mg/dL   GFR calc non Af Amer >60 >60 mL/min   GFR calc Af Amer >60 >60 mL/min    Comment: (NOTE) The eGFR has been calculated using the CKD EPI equation. This calculation has not been validated in all clinical situations. eGFR's persistently <60 mL/min signify possible Chronic Kidney Disease.    Anion gap 6 5 - 15   No results found.  Review of Systems  Constitutional: Negative.  Negative for chills and fever.  HENT: Negative.   Eyes: Negative.   Respiratory: Negative.   Cardiovascular: Negative.   Gastrointestinal: Negative.   Genitourinary: Negative.   Musculoskeletal: Negative.   Skin: Negative.   Neurological: Negative.   Endo/Heme/Allergies: Negative.   Psychiatric/Behavioral: Negative.     There were no vitals taken for  this visit. Physical Exam  Constitutional: He appears well-developed.  HENT:  Head: Normocephalic.  Eyes: Pupils are equal, round, and reactive to light.  Neck: Normal range of motion.  Cardiovascular: Normal rate.  Respiratory: Effort normal.  GI: Soft.  Genitourinary:  Genitourinary Comments: No CVAT  Musculoskeletal: Normal range of motion.  Neurological: He is alert.  Skin: Skin is warm.  Psychiatric: He has a normal mood and affect.     Assessment/Plan  Proceed as planned with robotic prostatectomy with ICG sentinal + template pelvic node dissection. Risks, benefits, alternatives, expected peri-op course discussed extensively previously and reiterated today.   Alexis Frock, MD 10/16/2017, 6:01 AM

## 2017-10-17 ENCOUNTER — Encounter (HOSPITAL_COMMUNITY): Payer: Self-pay | Admitting: Urology

## 2017-10-17 DIAGNOSIS — Z7982 Long term (current) use of aspirin: Secondary | ICD-10-CM | POA: Diagnosis not present

## 2017-10-17 DIAGNOSIS — C775 Secondary and unspecified malignant neoplasm of intrapelvic lymph nodes: Secondary | ICD-10-CM | POA: Diagnosis not present

## 2017-10-17 DIAGNOSIS — Z8042 Family history of malignant neoplasm of prostate: Secondary | ICD-10-CM | POA: Diagnosis not present

## 2017-10-17 DIAGNOSIS — C61 Malignant neoplasm of prostate: Secondary | ICD-10-CM | POA: Diagnosis not present

## 2017-10-17 DIAGNOSIS — Z96651 Presence of right artificial knee joint: Secondary | ICD-10-CM | POA: Diagnosis not present

## 2017-10-17 DIAGNOSIS — Z79899 Other long term (current) drug therapy: Secondary | ICD-10-CM | POA: Diagnosis not present

## 2017-10-17 DIAGNOSIS — F17210 Nicotine dependence, cigarettes, uncomplicated: Secondary | ICD-10-CM | POA: Diagnosis not present

## 2017-10-17 LAB — HEMOGLOBIN AND HEMATOCRIT, BLOOD
HEMATOCRIT: 39.3 % (ref 39.0–52.0)
Hemoglobin: 13.3 g/dL (ref 13.0–17.0)

## 2017-10-17 LAB — CREATININE, FLUID (PLEURAL, PERITONEAL, JP DRAINAGE): Creat, Fluid: 0.8 mg/dL

## 2017-10-17 LAB — BASIC METABOLIC PANEL
ANION GAP: 7 (ref 5–15)
BUN: 8 mg/dL (ref 6–20)
CALCIUM: 8.8 mg/dL — AB (ref 8.9–10.3)
CHLORIDE: 106 mmol/L (ref 101–111)
CO2: 24 mmol/L (ref 22–32)
Creatinine, Ser: 0.87 mg/dL (ref 0.61–1.24)
GFR calc Af Amer: >60 mL/min (ref >60)
GFR calc non Af Amer: >60 mL/min (ref >60)
GLUCOSE: 139 mg/dL — AB (ref 65–99)
Potassium: 4 mmol/L (ref 3.5–5.1)
Sodium: 137 mmol/L (ref 135–145)

## 2017-10-17 NOTE — Discharge Summary (Signed)
Physician Discharge Summary      Patient ID: Seth Herrera MRN: [id number] DOB/AGE: 03-Apr-1966 51 y.o.  Admit date: 10/16/2017 Discharge date: 10/17/2017  Admission Diagnoses: PROSTATE CANCER  Discharge Diagnoses:  Active Problems:   Prostate cancer [facility])   Discharged Condition: good  Hospital Course: He underwent elective robotic radical prostatectomy and is doing well postoperatively.  He currently has no nausea or significant pain.  His incisions are healing.  We discussed the need for Foley catheter drainage upon discharge.  His drain output is slightly elevated so it will be sent for creatinine and if consistent with serum his drain will be removed prior to discharge otherwise will be left in place and removed as an outpatient.  Significant Diagnostic Studies: No results found.  Discharge Exam: Blood pressure (!) 141/78, pulse 60, temperature 98.6 F (37 C), temperature source Oral, resp. rate 16, height 6\' 4"  (1.93 m), weight 101.6 kg (223 lb 15.8 oz), SpO2 97 %.  Awake, alert and in no distress. Chest normal respiratory effort. Abdomen flat, soft and nontender without mass.  Incisions are healing with no discharge. Foley catheter draining clear urine.   Disposition: Final discharge disposition not confirmed    Follow-up Information    Alexis Frock, MD On 10/26/2017.   Specialty:  Urology Why:  at 9:45 for MD visit and office catheter removal. Dr. Tresa Moore will call you with pathology results when available.  Contact information: [place] [address] [phone]           Signed: Claybon Jabs 10/17/2017, 7:58 AM

## 2017-10-19 NOTE — Op Note (Signed)
NAMEARYA, BOXLEY NO.:  [id number]  MEDICAL RECORD NO.:  [id number]  LOCATION:                                 FACILITY:  PHYSICIAN:  Alexis Frock, MD          DATE OF BIRTH:  DATE OF PROCEDURE:  10/16/2017                              OPERATIVE REPORT   DIAGNOSIS:  Large-volume moderate risk prostate cancer.  PROCEDURES: 1. Robotic-assisted laparoscopic radical prostatectomy. 2. Bilateral pelvic lymphadenectomy. 3. Injection of indocyanine green dye for sentinel lymphangiography.  ASSISTANT:  Debbrah Alar, PA.  ESTIMATED BLOOD LOSS:  150 mL.  COMPLICATION:  None.  SPECIMENS: 1. Periprostatic fat to permanent pathology. 2. Prostatectomy. 3. Right external iliac lymph nodes. 4. Right obturator lymph nodes. 5. Left external iliac lymph node, sentinel. 6. Left obturator lymph node, sentinel.  FINDINGS:  Obvious sentinel lymph nodes and lymphatic channels coursing over the left side of the prostate and bladder with sentinel nodes in the left external iliac and obturator groups respectively.  DRAINS: 1. Foley catheter to straight drain. 2. Jackson-Pratt to bulb suction.  INDICATION:  Mr. Calixte is a very pleasant and quite vigorous 51 year old gentleman, who was found on workup with elevated PSA to have large- volume adenocarcinoma of the prostate more on the left than the right. Options were discussed for management including surveillance protocols versus ablative therapy versus surgical extirpation.  He adamantly wished to proceed with prostatectomy.  Informed consent was obtained and placed in the medical record.  PROCEDURE IN DETAIL:  The patient being Kahmari Koller, was verified. Procedure being robotic prostatectomy was confirmed.  Procedure was carried out.  Time-out was performed.  Intravenous antibiotics were administered.  General endotracheal anesthesia was introduced.  The patient was placed into a low lithotomy position, and  sterile field was created by prepping and draping the patient's penis, perineum and proximal thighs using iodine and his infra-xiphoid abdomen using chlorhexidine gluconate.  He was further fashioned to operative table using 3-inch tape over foam padding across his supraxiphoid chest.  His arm was tucked to the side using gel rolls.  A test of steep Trendelenburg positioning was performed, he was found to be suitably positioned.  Next, a high-flow, low-pressure pneumoperitoneum was obtained using Veress technique in the infraumbilical midline having passed the aspiration and drop test.  An 8-mm robotic camera port was then placed into the same location.  Laparoscopic examination of the peritoneal cavity revealed no significant adhesions and no visceral injury.  Additional ports were then placed as follows:  Right paramedian 8-mm robotic port, right paramedian 5-mm superior suction port, right far lateral 12-mm AirSeal assistant port, left paramedian 8-mm robotic port, left far lateral 8-mm robotic port.  Robot was docked and passed through the electronic checks.  Initial attention was directed at development of the space of Retzius.  Incision was made lateral to the left medial umbilical ligament from midline towards the area of the internal ring coursing along the iliac vessels towards the area of the left ureter.  The left vas deferens was encountered, purposely ligated and used as a medial bucket handle.  The left bladder wall was swept away from  the pelvic sidewall towards the area of the endopelvic fascia on the left side.  A mirror-imaged dissection was performed on the right side freeing the right pelvic sidewall.  Anterior attachments were taken down using cautery scissors.  This exposed the anterior base of the prostate, which was defatted to better demarcate the bladder neck and prostate junction.  This was set aside, labeled as periprostatic fat. Next, 0.2 mL of indocyanine  green dye was injected into each lobe of the prostate using a percutaneously-placed, robotically-guided spinal needle just above the area of the pubic symphysis.  Intervening suctioning was performed to prevent dye spillage, which did not occur.  Next, the endopelvic fascia was carefully swept away from the lateral aspect of the prostate in a base-to-apex orientation and exposed the dorsal venous complex, which was carefully controlled using vascular load stapler. Taken exquisite care to avoid membranous urethral injury, which did not occur, it had been approximately 10 minutes post dye injection and the pelvis was inspected under near-infrared fluorescence light.  Sentinel lymphangiography revealed no obvious sentinel lymph nodes or channels on the right hemipelvis.  On the left side, there were multiple channels and nodes noted.  These nodes corresponded to the external iliac and obturator groups respectively.  Attention was directed at right-sided pelvic lymphadenectomy, first the right external iliac lymph nodes with confines being the right external iliac artery, vein and pelvic side wall, iliac bifurcation.  Lymphostasis was achieved with cold clips, set aside for permanent pathology.  Next, the right obturator group was dissected free with confines being obturator nerve, pelvic side wall, external iliac vein.  Lymphostasis was again achieved with cold clips.  The obturator nerve was inspected following these maneuvers and found to be uninjured.  Next, a mirror-imaged lymphadenectomy was performed on the left side of the left external iliac and left common iliac groups respectively.  There were sentinel nodes located in both packets, these were labeled as such.  The left obturator nerve was inspected following these maneuvers and found to be intact.  Attention was directed at bladder neck dissection.  The bladder neck was identified in the anterior plane moving the Foley catheter  back and forth.  A lateral release was performed to further demarcate this junction and the bladder neck was separated from the base of the prostate to anterior-posterior direction keeping what appeared to be a rim of circular muscle fibers with those to prostatectomy and bladder neck aspects, and posterior dissection was performed by incising approximately 7 mm inferior-posterior to posterior lip of the prostate entering the plane of Denonvilliers.  Bilateral seminal vesicles and vas deferens were encountered.  The bilateral vasa were dissected for distance approximately 4 cm, ligated and placed on gentle superior traction.  Bilateral seminal vesicles were dissected to their tip, also placed on gentle superior traction.  Dissection proceeded within this plane inferiorly towards the area of the apex of the prostate.  This exposed the vascular pedicles on both sides.  Right-sided pedicle dissection was then performed using a sequential clipping technique in a base-to-apex orientation.  Moderately aggressive nerve sparing was performed on the right side given this predominantly left-sided disease. Similarly, the left neurovascular pedicle was controlled using sequential clipping technique taking a purposeful wide dissection without nerve-sparing given the large volume of left lateral disease. Final apical dissection was performed on the anterior plane placing the prostate in a superior traction and coldly transecting the membranous urethra.  The prostatectomy specimen was then placed in EndoCatch bag for  later retrieval.  Digital rectal exam was then performed using indicator glove under laparoscopic vision.  No evidence of rectal violation was noted.  Next, posterior reconstruction was performed using a single V-Loc suture reapproximating the posterior urethral plate to the posterior bladder neck, bringing these structures in a tension-free apposition.  Next, mucosa-to-mucosa anastomosis  was performed using double-armed 3-0 V-Loc suture reapproximating the bladder neck and membranous urethra.  This resulted in excellent tension-free mucosal apposition.  New Foley catheter was then placed per urethra, which irrigated quantitatively.  Closed suction drain was brought through the previous left lateral most robotic port site near the peritoneal cavity. Robot was then undocked after closing the right 12-mm lateral assistant post using Carter-Thomason suture passer under laparoscopic vision.  The specimen was retrieved by extending the previous camera port site inferiorly for distance approximately 3.5 cm removing the prostatectomy specimen, setting aside for permanent pathology.  The extraction site was closed at the level of the fascia using figure-of-eight PDS x3 followed by reapproximation of Scarpa's with running Vicryl.  All incision sites were infiltrated with dilute lyophilized Marcaine and closed at the level of the skin using subcuticular Monocryl followed by Dermabond.  Procedure was then terminated.  The patient tolerated the procedure well.  There were no immediate periprocedural complications. The patient was taken to the postanesthesia care unit in stable condition.    ______________________________ Alexis Frock, MD   ______________________________ Alexis Frock, MD    TM/MEDQ  D:  10/16/2017  T:  10/17/2017  Job:  [id number]

## 2017-11-13 DIAGNOSIS — M62838 Other muscle spasm: Secondary | ICD-10-CM | POA: Diagnosis not present

## 2017-11-13 DIAGNOSIS — M6281 Muscle weakness (generalized): Secondary | ICD-10-CM | POA: Diagnosis not present

## 2017-11-13 DIAGNOSIS — N393 Stress incontinence (female) (male): Secondary | ICD-10-CM | POA: Diagnosis not present

## 2017-12-04 DIAGNOSIS — N393 Stress incontinence (female) (male): Secondary | ICD-10-CM | POA: Diagnosis not present

## 2017-12-04 DIAGNOSIS — M62838 Other muscle spasm: Secondary | ICD-10-CM | POA: Diagnosis not present

## 2017-12-04 DIAGNOSIS — C61 Malignant neoplasm of prostate: Secondary | ICD-10-CM | POA: Diagnosis not present

## 2017-12-04 DIAGNOSIS — M6281 Muscle weakness (generalized): Secondary | ICD-10-CM | POA: Diagnosis not present

## 2017-12-09 DIAGNOSIS — M170 Bilateral primary osteoarthritis of knee: Secondary | ICD-10-CM | POA: Diagnosis not present

## 2017-12-09 DIAGNOSIS — Z96651 Presence of right artificial knee joint: Secondary | ICD-10-CM | POA: Diagnosis not present

## 2017-12-09 DIAGNOSIS — N529 Male erectile dysfunction, unspecified: Secondary | ICD-10-CM | POA: Diagnosis not present

## 2017-12-09 DIAGNOSIS — R972 Elevated prostate specific antigen [PSA]: Secondary | ICD-10-CM | POA: Diagnosis not present

## 2017-12-24 DIAGNOSIS — M62838 Other muscle spasm: Secondary | ICD-10-CM | POA: Diagnosis not present

## 2017-12-24 DIAGNOSIS — C61 Malignant neoplasm of prostate: Secondary | ICD-10-CM | POA: Diagnosis not present

## 2017-12-24 DIAGNOSIS — N393 Stress incontinence (female) (male): Secondary | ICD-10-CM | POA: Diagnosis not present

## 2017-12-24 DIAGNOSIS — M6281 Muscle weakness (generalized): Secondary | ICD-10-CM | POA: Diagnosis not present

## 2017-12-30 DIAGNOSIS — R1032 Left lower quadrant pain: Secondary | ICD-10-CM | POA: Diagnosis not present

## 2018-01-21 DIAGNOSIS — M62838 Other muscle spasm: Secondary | ICD-10-CM | POA: Diagnosis not present

## 2018-01-21 DIAGNOSIS — C61 Malignant neoplasm of prostate: Secondary | ICD-10-CM | POA: Diagnosis not present

## 2018-01-21 DIAGNOSIS — M6281 Muscle weakness (generalized): Secondary | ICD-10-CM | POA: Diagnosis not present

## 2018-01-21 DIAGNOSIS — N393 Stress incontinence (female) (male): Secondary | ICD-10-CM | POA: Diagnosis not present

## 2018-01-26 DIAGNOSIS — C61 Malignant neoplasm of prostate: Secondary | ICD-10-CM | POA: Diagnosis not present

## 2018-01-28 DIAGNOSIS — N393 Stress incontinence (female) (male): Secondary | ICD-10-CM | POA: Diagnosis not present

## 2018-01-28 DIAGNOSIS — C61 Malignant neoplasm of prostate: Secondary | ICD-10-CM | POA: Diagnosis not present

## 2018-01-28 DIAGNOSIS — N5201 Erectile dysfunction due to arterial insufficiency: Secondary | ICD-10-CM | POA: Diagnosis not present

## 2018-02-18 DIAGNOSIS — M6281 Muscle weakness (generalized): Secondary | ICD-10-CM | POA: Diagnosis not present

## 2018-02-18 DIAGNOSIS — M62838 Other muscle spasm: Secondary | ICD-10-CM | POA: Diagnosis not present

## 2018-02-18 DIAGNOSIS — C61 Malignant neoplasm of prostate: Secondary | ICD-10-CM | POA: Diagnosis not present

## 2018-02-18 DIAGNOSIS — N393 Stress incontinence (female) (male): Secondary | ICD-10-CM | POA: Diagnosis not present

## 2018-04-21 DIAGNOSIS — H524 Presbyopia: Secondary | ICD-10-CM | POA: Diagnosis not present

## 2018-06-08 DIAGNOSIS — Z0000 Encounter for general adult medical examination without abnormal findings: Secondary | ICD-10-CM | POA: Diagnosis not present

## 2018-06-08 DIAGNOSIS — Z1321 Encounter for screening for nutritional disorder: Secondary | ICD-10-CM | POA: Diagnosis not present

## 2018-07-14 DIAGNOSIS — C61 Malignant neoplasm of prostate: Secondary | ICD-10-CM | POA: Diagnosis not present

## 2018-07-20 DIAGNOSIS — C61 Malignant neoplasm of prostate: Secondary | ICD-10-CM | POA: Diagnosis not present

## 2018-07-20 DIAGNOSIS — C775 Secondary and unspecified malignant neoplasm of intrapelvic lymph nodes: Secondary | ICD-10-CM | POA: Diagnosis not present

## 2018-07-20 DIAGNOSIS — N393 Stress incontinence (female) (male): Secondary | ICD-10-CM | POA: Diagnosis not present

## 2018-07-20 DIAGNOSIS — N5201 Erectile dysfunction due to arterial insufficiency: Secondary | ICD-10-CM | POA: Diagnosis not present

## 2018-09-01 DIAGNOSIS — N5201 Erectile dysfunction due to arterial insufficiency: Secondary | ICD-10-CM | POA: Diagnosis not present

## 2018-09-03 DIAGNOSIS — M19072 Primary osteoarthritis, left ankle and foot: Secondary | ICD-10-CM | POA: Diagnosis not present

## 2018-09-03 DIAGNOSIS — M1712 Unilateral primary osteoarthritis, left knee: Secondary | ICD-10-CM | POA: Diagnosis not present

## 2018-09-03 DIAGNOSIS — M25462 Effusion, left knee: Secondary | ICD-10-CM | POA: Diagnosis not present

## 2018-09-03 DIAGNOSIS — K4090 Unilateral inguinal hernia, without obstruction or gangrene, not specified as recurrent: Secondary | ICD-10-CM | POA: Diagnosis not present

## 2018-09-27 DIAGNOSIS — Z01818 Encounter for other preprocedural examination: Secondary | ICD-10-CM | POA: Diagnosis not present

## 2018-10-11 DIAGNOSIS — M1712 Unilateral primary osteoarthritis, left knee: Secondary | ICD-10-CM | POA: Diagnosis not present

## 2018-10-11 DIAGNOSIS — Z96652 Presence of left artificial knee joint: Secondary | ICD-10-CM | POA: Diagnosis not present

## 2018-10-12 DIAGNOSIS — Z96652 Presence of left artificial knee joint: Secondary | ICD-10-CM | POA: Diagnosis not present

## 2018-10-13 DIAGNOSIS — Z96651 Presence of right artificial knee joint: Secondary | ICD-10-CM | POA: Diagnosis not present

## 2018-10-13 DIAGNOSIS — Z471 Aftercare following joint replacement surgery: Secondary | ICD-10-CM | POA: Diagnosis not present

## 2018-10-13 DIAGNOSIS — Z96652 Presence of left artificial knee joint: Secondary | ICD-10-CM | POA: Diagnosis not present

## 2018-10-16 DIAGNOSIS — Z79891 Long term (current) use of opiate analgesic: Secondary | ICD-10-CM | POA: Diagnosis not present

## 2018-10-16 DIAGNOSIS — Z96653 Presence of artificial knee joint, bilateral: Secondary | ICD-10-CM | POA: Diagnosis not present

## 2018-10-16 DIAGNOSIS — Z9181 History of falling: Secondary | ICD-10-CM | POA: Diagnosis not present

## 2018-10-16 DIAGNOSIS — Z471 Aftercare following joint replacement surgery: Secondary | ICD-10-CM | POA: Diagnosis not present

## 2018-10-16 DIAGNOSIS — Z7982 Long term (current) use of aspirin: Secondary | ICD-10-CM | POA: Diagnosis not present

## 2018-10-16 DIAGNOSIS — F17210 Nicotine dependence, cigarettes, uncomplicated: Secondary | ICD-10-CM | POA: Diagnosis not present

## 2018-10-19 DIAGNOSIS — Z96652 Presence of left artificial knee joint: Secondary | ICD-10-CM | POA: Diagnosis not present

## 2018-10-21 DIAGNOSIS — Z96653 Presence of artificial knee joint, bilateral: Secondary | ICD-10-CM | POA: Diagnosis not present

## 2018-10-21 DIAGNOSIS — Z7982 Long term (current) use of aspirin: Secondary | ICD-10-CM | POA: Diagnosis not present

## 2018-10-21 DIAGNOSIS — Z471 Aftercare following joint replacement surgery: Secondary | ICD-10-CM | POA: Diagnosis not present

## 2018-10-21 DIAGNOSIS — Z79891 Long term (current) use of opiate analgesic: Secondary | ICD-10-CM | POA: Diagnosis not present

## 2018-10-21 DIAGNOSIS — Z9181 History of falling: Secondary | ICD-10-CM | POA: Diagnosis not present

## 2018-10-21 DIAGNOSIS — F17210 Nicotine dependence, cigarettes, uncomplicated: Secondary | ICD-10-CM | POA: Diagnosis not present

## 2018-10-22 DIAGNOSIS — Z7982 Long term (current) use of aspirin: Secondary | ICD-10-CM | POA: Diagnosis not present

## 2018-10-22 DIAGNOSIS — F17210 Nicotine dependence, cigarettes, uncomplicated: Secondary | ICD-10-CM | POA: Diagnosis not present

## 2018-10-22 DIAGNOSIS — Z79891 Long term (current) use of opiate analgesic: Secondary | ICD-10-CM | POA: Diagnosis not present

## 2018-10-22 DIAGNOSIS — Z96653 Presence of artificial knee joint, bilateral: Secondary | ICD-10-CM | POA: Diagnosis not present

## 2018-10-22 DIAGNOSIS — Z9181 History of falling: Secondary | ICD-10-CM | POA: Diagnosis not present

## 2018-10-22 DIAGNOSIS — Z471 Aftercare following joint replacement surgery: Secondary | ICD-10-CM | POA: Diagnosis not present

## 2018-10-25 DIAGNOSIS — F17210 Nicotine dependence, cigarettes, uncomplicated: Secondary | ICD-10-CM | POA: Diagnosis not present

## 2018-10-25 DIAGNOSIS — Z96653 Presence of artificial knee joint, bilateral: Secondary | ICD-10-CM | POA: Diagnosis not present

## 2018-10-25 DIAGNOSIS — Z471 Aftercare following joint replacement surgery: Secondary | ICD-10-CM | POA: Diagnosis not present

## 2018-10-25 DIAGNOSIS — Z7982 Long term (current) use of aspirin: Secondary | ICD-10-CM | POA: Diagnosis not present

## 2018-10-25 DIAGNOSIS — Z79891 Long term (current) use of opiate analgesic: Secondary | ICD-10-CM | POA: Diagnosis not present

## 2018-10-25 DIAGNOSIS — Z9181 History of falling: Secondary | ICD-10-CM | POA: Diagnosis not present

## 2018-10-26 DIAGNOSIS — Z9181 History of falling: Secondary | ICD-10-CM | POA: Diagnosis not present

## 2018-10-26 DIAGNOSIS — Z96653 Presence of artificial knee joint, bilateral: Secondary | ICD-10-CM | POA: Diagnosis not present

## 2018-10-26 DIAGNOSIS — Z7982 Long term (current) use of aspirin: Secondary | ICD-10-CM | POA: Diagnosis not present

## 2018-10-26 DIAGNOSIS — Z471 Aftercare following joint replacement surgery: Secondary | ICD-10-CM | POA: Diagnosis not present

## 2018-10-26 DIAGNOSIS — Z79891 Long term (current) use of opiate analgesic: Secondary | ICD-10-CM | POA: Diagnosis not present

## 2018-10-26 DIAGNOSIS — F17210 Nicotine dependence, cigarettes, uncomplicated: Secondary | ICD-10-CM | POA: Diagnosis not present

## 2018-11-09 DIAGNOSIS — M25462 Effusion, left knee: Secondary | ICD-10-CM | POA: Diagnosis not present

## 2018-11-09 DIAGNOSIS — Z96652 Presence of left artificial knee joint: Secondary | ICD-10-CM | POA: Diagnosis not present

## 2018-12-10 DIAGNOSIS — C61 Malignant neoplasm of prostate: Secondary | ICD-10-CM | POA: Diagnosis not present

## 2018-12-10 DIAGNOSIS — Z13220 Encounter for screening for lipoid disorders: Secondary | ICD-10-CM | POA: Diagnosis not present

## 2018-12-10 DIAGNOSIS — N529 Male erectile dysfunction, unspecified: Secondary | ICD-10-CM | POA: Diagnosis not present

## 2018-12-10 DIAGNOSIS — Z0000 Encounter for general adult medical examination without abnormal findings: Secondary | ICD-10-CM | POA: Diagnosis not present

## 2018-12-10 DIAGNOSIS — M25561 Pain in right knee: Secondary | ICD-10-CM | POA: Diagnosis not present

## 2018-12-10 DIAGNOSIS — Z131 Encounter for screening for diabetes mellitus: Secondary | ICD-10-CM | POA: Diagnosis not present

## 2018-12-10 DIAGNOSIS — Z1321 Encounter for screening for nutritional disorder: Secondary | ICD-10-CM | POA: Diagnosis not present

## 2018-12-10 DIAGNOSIS — M25562 Pain in left knee: Secondary | ICD-10-CM | POA: Diagnosis not present

## 2018-12-27 DIAGNOSIS — C61 Malignant neoplasm of prostate: Secondary | ICD-10-CM | POA: Diagnosis not present

## 2019-01-03 DIAGNOSIS — C61 Malignant neoplasm of prostate: Secondary | ICD-10-CM | POA: Diagnosis not present

## 2019-01-03 DIAGNOSIS — N393 Stress incontinence (female) (male): Secondary | ICD-10-CM | POA: Diagnosis not present

## 2019-01-03 DIAGNOSIS — C775 Secondary and unspecified malignant neoplasm of intrapelvic lymph nodes: Secondary | ICD-10-CM | POA: Diagnosis not present

## 2019-01-03 DIAGNOSIS — N5201 Erectile dysfunction due to arterial insufficiency: Secondary | ICD-10-CM | POA: Diagnosis not present

## 2019-01-13 DIAGNOSIS — H04123 Dry eye syndrome of bilateral lacrimal glands: Secondary | ICD-10-CM | POA: Diagnosis not present

## 2019-01-13 DIAGNOSIS — H524 Presbyopia: Secondary | ICD-10-CM | POA: Diagnosis not present

## 2019-01-13 DIAGNOSIS — H5212 Myopia, left eye: Secondary | ICD-10-CM | POA: Diagnosis not present

## 2019-01-13 DIAGNOSIS — H52203 Unspecified astigmatism, bilateral: Secondary | ICD-10-CM | POA: Diagnosis not present

## 2019-02-02 DIAGNOSIS — H52203 Unspecified astigmatism, bilateral: Secondary | ICD-10-CM | POA: Diagnosis not present

## 2019-02-02 DIAGNOSIS — H524 Presbyopia: Secondary | ICD-10-CM | POA: Diagnosis not present

## 2019-02-02 DIAGNOSIS — H5212 Myopia, left eye: Secondary | ICD-10-CM | POA: Diagnosis not present

## 2019-04-06 DIAGNOSIS — Z96652 Presence of left artificial knee joint: Secondary | ICD-10-CM | POA: Diagnosis not present

## 2019-04-06 DIAGNOSIS — Z96651 Presence of right artificial knee joint: Secondary | ICD-10-CM | POA: Diagnosis not present

## 2019-07-05 DIAGNOSIS — Z96652 Presence of left artificial knee joint: Secondary | ICD-10-CM | POA: Diagnosis not present

## 2019-07-05 DIAGNOSIS — M545 Low back pain: Secondary | ICD-10-CM | POA: Diagnosis not present

## 2019-08-02 DIAGNOSIS — M545 Low back pain: Secondary | ICD-10-CM | POA: Diagnosis not present

## 2019-09-09 DIAGNOSIS — C61 Malignant neoplasm of prostate: Secondary | ICD-10-CM | POA: Diagnosis not present

## 2019-09-15 ENCOUNTER — Emergency Department (HOSPITAL_COMMUNITY)
Admission: EM | Admit: 2019-09-15 | Discharge: 2019-09-15 | Disposition: A | Discharge status: Home / Self Care | Payer: BC Managed Care – PPO | Attending: Emergency Medicine | Admitting: Emergency Medicine

## 2019-09-15 ENCOUNTER — Other Ambulatory Visit: Payer: Self-pay

## 2019-09-15 ENCOUNTER — Encounter (HOSPITAL_COMMUNITY): Payer: Self-pay

## 2019-09-15 ENCOUNTER — Emergency Department (HOSPITAL_COMMUNITY): Payer: BC Managed Care – PPO

## 2019-09-15 DIAGNOSIS — C61 Malignant neoplasm of prostate: Secondary | ICD-10-CM | POA: Diagnosis not present

## 2019-09-15 DIAGNOSIS — N5201 Erectile dysfunction due to arterial insufficiency: Secondary | ICD-10-CM | POA: Diagnosis not present

## 2019-09-15 DIAGNOSIS — S40811A Abrasion of right upper arm, initial encounter: Secondary | ICD-10-CM | POA: Diagnosis not present

## 2019-09-15 DIAGNOSIS — R030 Elevated blood-pressure reading, without diagnosis of hypertension: Secondary | ICD-10-CM | POA: Diagnosis not present

## 2019-09-15 DIAGNOSIS — S59911A Unspecified injury of right forearm, initial encounter: Secondary | ICD-10-CM | POA: Diagnosis not present

## 2019-09-15 DIAGNOSIS — S3992XA Unspecified injury of lower back, initial encounter: Secondary | ICD-10-CM | POA: Diagnosis not present

## 2019-09-15 DIAGNOSIS — F17210 Nicotine dependence, cigarettes, uncomplicated: Secondary | ICD-10-CM | POA: Diagnosis not present

## 2019-09-15 DIAGNOSIS — M545 Low back pain: Secondary | ICD-10-CM | POA: Insufficient documentation

## 2019-09-15 DIAGNOSIS — Y9389 Activity, other specified: Secondary | ICD-10-CM | POA: Insufficient documentation

## 2019-09-15 DIAGNOSIS — Z8546 Personal history of malignant neoplasm of prostate: Secondary | ICD-10-CM | POA: Diagnosis not present

## 2019-09-15 DIAGNOSIS — Y999 Unspecified external cause status: Secondary | ICD-10-CM | POA: Insufficient documentation

## 2019-09-15 DIAGNOSIS — Z96651 Presence of right artificial knee joint: Secondary | ICD-10-CM | POA: Diagnosis not present

## 2019-09-15 DIAGNOSIS — Y92410 Unspecified street and highway as the place of occurrence of the external cause: Secondary | ICD-10-CM | POA: Insufficient documentation

## 2019-09-15 DIAGNOSIS — Z23 Encounter for immunization: Secondary | ICD-10-CM | POA: Insufficient documentation

## 2019-09-15 DIAGNOSIS — C775 Secondary and unspecified malignant neoplasm of intrapelvic lymph nodes: Secondary | ICD-10-CM | POA: Diagnosis not present

## 2019-09-15 DIAGNOSIS — N393 Stress incontinence (female) (male): Secondary | ICD-10-CM | POA: Diagnosis not present

## 2019-09-15 MED ORDER — MELOXICAM 7.5 MG PO TABS: 7.5000 mg | ORAL_TABLET | Freq: Every day | ORAL | 0 refills | Status: AC

## 2019-09-15 MED ORDER — TETANUS-DIPHTH-ACELL PERTUSSIS 5-2.5-18.5 LF-MCG/0.5 IM SUSP
0.5000 mL | Freq: Once | INTRAMUSCULAR | Status: AC
  Administered 2019-09-15: 23:00:00 0.5 mL via INTRAMUSCULAR
  Filled 2019-09-15: qty 0.5

## 2019-09-15 MED ORDER — CYCLOBENZAPRINE HCL 10 MG PO TABS: 10.0000 mg | ORAL_TABLET | Freq: Two times a day (BID) | ORAL | 0 refills | Status: AC | PRN
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
## 2019-09-15 NOTE — Discharge Instructions (Addendum)
I have prescribed you Mobic and Flexeril. Mobic is an NSAID which will help with pain and inflammation. Do not take with other NSAIDs such as ibuprofen, Aleve, etc. Flexeril is a muscle relaxer. Take as needed for muscle spasms. As discussed, the muscle relaxer can make you drowsy, so don't drive or operate machinery while on the medication. Return to the ER if you have severe pain that is not controllable at home, develop urinary or bowel incontinence, chest pain, shortness of breath, or develop a fever. I have given you a phone number for a primary care doctor. I recommend scheduling an appointment for a week to follow-up. Also, have them recheck your blood pressure because it was elevated today at the hospital. I have also provided you with low back exercises.
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
## 2019-09-20 DIAGNOSIS — M545 Low back pain: Secondary | ICD-10-CM | POA: Diagnosis not present

## 2019-10-04 DIAGNOSIS — M545 Low back pain: Secondary | ICD-10-CM | POA: Diagnosis not present

## 2019-11-03 DIAGNOSIS — M545 Low back pain: Secondary | ICD-10-CM | POA: Diagnosis not present
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
